# Patient Record
Sex: Female | Born: 1970 | Race: Black or African American | Hispanic: No | Marital: Married | Smoking: Never smoker
Health system: Southern US, Community
[De-identification: ages and names within clinical notes are randomized; demographics above are authoritative.]

## PROBLEM LIST (undated history)

## (undated) DIAGNOSIS — E119 Type 2 diabetes mellitus without complications: Secondary | ICD-10-CM

## (undated) DIAGNOSIS — I10 Essential (primary) hypertension: Secondary | ICD-10-CM

## (undated) HISTORY — PX: ABDOMINAL HYSTERECTOMY: SHX81

---

## 2019-06-13 ENCOUNTER — Other Ambulatory Visit: Payer: Self-pay

## 2019-06-13 ENCOUNTER — Emergency Department: Payer: Federal, State, Local not specified - PPO

## 2019-06-13 ENCOUNTER — Encounter: Payer: Self-pay | Admitting: Emergency Medicine

## 2019-06-13 ENCOUNTER — Emergency Department
Admission: EM | Admit: 2019-06-13 | Discharge: 2019-06-13 | Disposition: A | Discharge status: Home / Self Care | Payer: Federal, State, Local not specified - PPO | Attending: Emergency Medicine | Admitting: Emergency Medicine

## 2019-06-13 DIAGNOSIS — M545 Low back pain: Secondary | ICD-10-CM | POA: Diagnosis present

## 2019-06-13 DIAGNOSIS — M5442 Lumbago with sciatica, left side: Secondary | ICD-10-CM | POA: Diagnosis not present

## 2019-06-13 DIAGNOSIS — M5441 Lumbago with sciatica, right side: Secondary | ICD-10-CM | POA: Diagnosis not present

## 2019-06-13 LAB — URINALYSIS, COMPLETE (UACMP) WITH MICROSCOPIC
Bilirubin Urine: NEGATIVE
Glucose, UA: >=500 mg/dL — AB
Hgb urine dipstick: NEGATIVE
Ketones, ur: 80 mg/dL — AB
Leukocytes,Ua: NEGATIVE
Nitrite: NEGATIVE
Protein, ur: NEGATIVE mg/dL
Specific Gravity, Urine: 1.028 (ref 1.005–1.030)
pH: 5 (ref 5.0–8.0)

## 2019-06-13 LAB — GLUCOSE, CAPILLARY: Glucose-Capillary: 241 mg/dL — ABNORMAL HIGH (ref 70–99)

## 2019-06-13 MED ORDER — ETODOLAC 400 MG PO TABS: 400.0000 mg | ORAL_TABLET | Freq: Two times a day (BID) | ORAL | 0 refills | Status: DC

## 2019-06-13 MED ORDER — PREDNISONE 10 MG PO TABS: ORAL_TABLET | ORAL | 0 refills | Status: DC

## 2019-06-13 MED ORDER — HYDROCODONE-ACETAMINOPHEN 5-325 MG PO TABS: 1.0000 | ORAL_TABLET | Freq: Four times a day (QID) | ORAL | 0 refills | Status: DC | PRN

## 2019-06-13 NOTE — ED Notes (Signed)
Urine sent to lab with white pt label, lab informed

## 2019-06-13 NOTE — ED Triage Notes (Signed)
Pt to ED via POV c/o back pain that radiates down her right leg. Pt is in NAD.

## 2019-06-13 NOTE — Discharge Instructions (Addendum)
Follow-up with your primary care provider if any continued problems.  You may use ice or heat to your back as needed for discomfort.  Begin taking etodolac 400 mg twice daily with food.  Also Norco 1 every 6 hours as needed for pain was sent to your pharmacy.  Do not drive or operate machinery while taking the pain medication as it could cause drowsiness.

## 2019-06-13 NOTE — ED Provider Notes (Signed)
[facility] Emergency Department Provider Note  ____________________________________________   First MD Initiated Contact with Patient 06/13/19 1344     (approximate)  I have reviewed the triage vital signs and the nursing notes.   HISTORY  Chief Complaint Back Pain  HPI Deborah Howe is a 48 y.o. female presents to the ED with complaint of low back pain with radiation into her right leg.  Patient also states that occasionally it goes down her left leg as well.  She continues to ambulate without any assistance.  She denies any recent injury to her back and no previous sciatica.  Patient states she has been alternating Tylenol and ibuprofen without any relief.  She rates her pain as a 9 out of 10.      History reviewed. No pertinent past medical history.  There are no active problems to display for this patient.   Past Surgical History:  Procedure Laterality Date  . ABDOMINAL HYSTERECTOMY      Prior to Admission medications   Medication Sig Start Date End Date Taking? Authorizing Provider  etodolac (LODINE) 400 MG tablet Take 1 tablet (400 mg total) by mouth 2 (two) times daily. 06/13/19   Johnn Hai, PA-C  HYDROcodone-acetaminophen (NORCO/VICODIN) 5-325 MG tablet Take 1 tablet by mouth every 6 (six) hours as needed for moderate pain. 06/13/19   Johnn Hai, PA-C    Allergies Penicillins  No family history on file.  Social History Social History   Tobacco Use  . Smoking status: Never Smoker  . Smokeless tobacco: Never Used  Substance Use Topics  . Alcohol use: Yes    Comment: social  . Drug use: Not Currently    Review of Systems Constitutional: No fever/chills Cardiovascular: Denies chest pain. Respiratory: Denies shortness of breath. Gastrointestinal: No abdominal pain.  No nausea, no vomiting.  Genitourinary: Negative for dysuria. Musculoskeletal: Positive for low back pain with radiculopathy Skin: Negative for  rash. Neurological: Negative for headaches, focal weakness or numbness. ____________________________________________   PHYSICAL EXAM:  VITAL SIGNS: ED Triage Vitals  Enc Vitals Group     BP 06/13/19 1304 (!) 146/103     Pulse Rate 06/13/19 1304 (!) 128     Resp 06/13/19 1304 16     Temp 06/13/19 1304 99.6 F (37.6 C)     Temp Source 06/13/19 1304 Oral     SpO2 06/13/19 1304 97 %     Weight 06/13/19 1258 160 lb (72.6 kg)     Height 06/13/19 1258 5\' 4"  (1.626 m)     Head Circumference --      Peak Flow --      Pain Score 06/13/19 1258 9     Pain Loc --      Pain Edu? --      Excl. in [place]? --    Constitutional: Alert and oriented. Well appearing and in no acute distress. Eyes: Conjunctivae are normal.  Head: Atraumatic. Neck: No stridor.   Cardiovascular: Normal rate, regular rhythm. Grossly normal heart sounds.  Good peripheral circulation. Respiratory: Normal respiratory effort.  No retractions. Lungs CTAB. Gastrointestinal: Soft and nontender. No distention.  Musculoskeletal: No gross deformities noted on examination of lower back.  Patient has good muscle strength bilaterally.  There is bilateral SI joint tenderness with surrounding tissue.  No active muscle spasms were seen.  Good muscle strength was noted bilaterally.  Straight leg raises were essentially negative. Neurologic:  Normal speech and language. No gross focal neurologic deficits are  appreciated.  Reflexes 2+ bilaterally.  No gait instability. Skin:  Skin is warm, dry and intact. No rash noted. Psychiatric: Mood and affect are normal. Speech and behavior are normal.  ____________________________________________   LABS (all labs ordered are listed, but only abnormal results are displayed)  Labs Reviewed  URINALYSIS, COMPLETE (UACMP) WITH MICROSCOPIC - Abnormal; Notable for the following components:      Result Value   Color, Urine YELLOW (*)    APPearance CLEAR (*)    Glucose, UA >=500 (*)    Ketones, ur 80  (*)    Bacteria, UA FEW (*)    All other components within normal limits  GLUCOSE, CAPILLARY - Abnormal; Notable for the following components:   Glucose-Capillary 241 (*)    All other components within normal limits  CBG MONITORING, ED   ____________________________________________  RADIOLOGY  Official radiology report(s): Dg Lumbar Spine 2-3 Views  Result Date: 06/13/2019 CLINICAL DATA:  Pain with bilateral radiculopathy for 2 weeks EXAM: LUMBAR SPINE - 2-3 VIEW COMPARISON:  MR lumbar spine dated 12/09/2015. FINDINGS: There is no evidence of lumbar spine fracture. Alignment is normal. Intervertebral disc spaces are maintained. Mild degenerative joint disease is seen at L4-5 and L5-S1. IMPRESSION: Mild degenerative joint disease at L4-5 and L5-S1. No acute findings. Electronically Signed   By: Romona Curlsyler  Litton M.D.   On: 06/13/2019 15:12    ____________________________________________   PROCEDURES  Procedure(s) performed (including Critical Care):  Procedures   ____________________________________________   INITIAL IMPRESSION / ASSESSMENT AND PLAN / ED COURSE  As part of my medical decision making, I reviewed the following data within the electronic MEDICAL RECORD NUMBER Notes from prior ED visits and [place] Controlled Substance Database  48 year old female presents to the ED with complaint of low back pain bilaterally with radiation into both her lower extremities however she complains of her right leg more than the left.  Patient continues to ambulate without any assistance.  She is taken over-the-counter medication without any relief.  She denies any recent injury.  Physical exam is consistent with sciatica however patient has not had her back ever x-rayed.  Lumbar spine x-ray showed degenerative changes only.  Patient was made aware.  She was placed on prednisone for inflammation and also Norco every 6 hours if needed for pain.  Prior to discharge patient was concerned that her urine was  not checked.  Urinalysis was negative for infection.  Ketones were noted in the urine and patient was encouraged to increase fluids.  She is to follow-up with her PCP.  Nonfasting glucose in the ED was 241 and prednisone taper was discontinued and etodolac was sent to the pharmacy instead.  ____________________________________________   FINAL CLINICAL IMPRESSION(S) / ED DIAGNOSES  Final diagnoses:  Acute bilateral low back pain with bilateral sciatica     ED Discharge Orders         Ordered    predniSONE (DELTASONE) 10 MG tablet  Status:  Discontinued     06/13/19 1536    HYDROcodone-acetaminophen (NORCO/VICODIN) 5-325 MG tablet  Every 6 hours PRN     06/13/19 1536    etodolac (LODINE) 400 MG tablet  2 times daily,   Status:  Discontinued     06/13/19 1633    etodolac (LODINE) 400 MG tablet  2 times daily     06/13/19 1633           Note:  This document was prepared using Dragon voice recognition software and may include unintentional dictation  errors.    Tommi Rumps, PA-C 06/14/19 1027    Jene Every, MD 06/14/19 [phone]

## 2019-06-13 NOTE — ED Notes (Signed)
Pt ambulatory in room with a steady gait.  

## 2019-06-22 ENCOUNTER — Emergency Department
Admission: EM | Admit: 2019-06-22 | Discharge: 2019-06-22 | Disposition: A | Discharge status: Home / Self Care | Payer: Federal, State, Local not specified - PPO | Attending: Student | Admitting: Student

## 2019-06-22 ENCOUNTER — Other Ambulatory Visit: Payer: Self-pay

## 2019-06-22 ENCOUNTER — Encounter: Payer: Self-pay | Admitting: Emergency Medicine

## 2019-06-22 DIAGNOSIS — M5431 Sciatica, right side: Secondary | ICD-10-CM | POA: Diagnosis not present

## 2019-06-22 DIAGNOSIS — M545 Low back pain: Secondary | ICD-10-CM | POA: Diagnosis present

## 2019-06-22 DIAGNOSIS — Z79899 Other long term (current) drug therapy: Secondary | ICD-10-CM | POA: Insufficient documentation

## 2019-06-22 MED ORDER — TIZANIDINE HCL 4 MG PO TABS: 4.0000 mg | ORAL_TABLET | Freq: Three times a day (TID) | ORAL | 0 refills | Status: DC

## 2019-06-22 MED ORDER — TIZANIDINE HCL 4 MG PO TABS
4.0000 mg | ORAL_TABLET | Freq: Once | ORAL | Status: AC
  Administered 2019-06-22: 4 mg via ORAL
  Filled 2019-06-22: qty 1

## 2019-06-22 NOTE — ED Notes (Signed)
Patient was seen here and at urgent care for same complaint of back pain with pain radiating down right leg. Patient still c/o right leg pain. Patient states oxycodone helps her sleep, but other medications are not helping the pain.

## 2019-06-22 NOTE — ED Provider Notes (Signed)
[facility] Emergency Department Provider Note ____________________________________________  Time seen: Approximately 11:01 PM  I have reviewed the triage vital signs and the nursing notes.  HISTORY  Chief Complaint Back Pain   HPI Deborah Howe is a 48 y.o. female who presents to the emergency department for treatment and evaluation of right side back pain.  No trauma.  She was treated and evaluated here on 06/14/2019 and then treated again for the same at [facility] clinic urgent care on 06/19/2019.  She states she is not received any relief from the prednisone or Flexeril.  She states that the oxycodone helps her sleep but otherwise her pain has not improved.  She denies any new symptoms but states that she will not be able to work with the amount of pain that she is in.  She states that the acute care would not provide her with a work excuse.  History reviewed. No pertinent past medical history.  There are no active problems to display for this patient.   Past Surgical History:  Procedure Laterality Date  . ABDOMINAL HYSTERECTOMY      Prior to Admission medications   Medication Sig Start Date End Date Taking? Authorizing Provider  etodolac (LODINE) 400 MG tablet Take 1 tablet (400 mg total) by mouth 2 (two) times daily. 06/13/19   Johnn Hai, PA-C  HYDROcodone-acetaminophen (NORCO/VICODIN) 5-325 MG tablet Take 1 tablet by mouth every 6 (six) hours as needed for moderate pain. 06/13/19   Johnn Hai, PA-C  tiZANidine (ZANAFLEX) 4 MG tablet Take 1 tablet (4 mg total) by mouth 3 (three) times daily. 06/22/19 06/21/20  Sherrie George B, FNP    Allergies Penicillins  No family history on file.  Social History Social History   Tobacco Use  . Smoking status: Never Smoker  . Smokeless tobacco: Never Used  Substance Use Topics  . Alcohol use: Yes    Comment: social  . Drug use: Not Currently    Review of Systems Constitutional: Well  appearing. Respiratory: Negative for dyspnea. Cardiovascular: Negative for change in skin temperature or color. Musculoskeletal:   Negative for chronic steroid use   Negative for trauma in the presence of osteoporosis  Negative for age over 72 and trauma.  Negative for constitutional symptoms, or history of cancer  Negative for pain worse at night. Skin: Negative for rash, lesion, or wound.  Genitourinary: Negative for urinary retention. Rectal: Negative for fecal incontinence or new onset constipation/bowel habit changes. Hematological/Immunilogical: Negative for immunosuppression, IV drug use, or fever Neurological: Positive for burning, tingling, numb, electric, radiating pain in the right lower extremity.                        Negative for saddle anesthesia.                        Negative for focal neurologic deficit, progressive or disabling symptoms             Negative for saddle anesthesia. ____________________________________________   PHYSICAL EXAM:  VITAL SIGNS: ED Triage Vitals  Enc Vitals Group     BP 06/22/19 1858 (!) 155/89     Pulse Rate 06/22/19 1856 100     Resp 06/22/19 1856 16     Temp 06/22/19 1856 98.6 F (37 C)     Temp Source 06/22/19 1856 Oral     SpO2 06/22/19 1856 100 %     Weight 06/22/19 1857  156 lb (70.8 kg)     Height 06/22/19 1857 5\' 4"  (1.626 m)     Head Circumference --      Peak Flow --      Pain Score 06/22/19 1857 9     Pain Loc --      Pain Edu? --      Excl. in [place]? --     Constitutional: Alert and oriented. Well appearing and in no acute distress. Eyes: Conjunctivae are clear without discharge or drainage.  Head: Atraumatic. Neck: Full, active range of motion. Respiratory: Respirations even and unlabored. Musculoskeletal: Guarded ROM of the back and right extremitiy Strength 5/5 of the lower extremities as tested. Neurologic: Reflexes of the lower extremities are 2+.  Unwilling to attempt straight leg raise on the right  side. Skin: Atraumatic.  Psychiatric: Behavior and affect are normal.  ____________________________________________   LABS (all labs ordered are listed, but only abnormal results are displayed)  Labs Reviewed - No data to display ____________________________________________  RADIOLOGY  Not indicated ____________________________________________   PROCEDURES  Procedure(s) performed:  Procedures ____________________________________________   INITIAL IMPRESSION / ASSESSMENT AND PLAN / ED COURSE  Pheonix Howe is a 48 y.o. female presenting to the emergency department mainly for work excuse until she can get into see Dr. 44 on 07/01/2019.  She also requested that change be made to her medication that may provide her with additional relief.  I will change her Flexeril to tizanidine and give her a work note for the next 4 days.  She was advised that the emergency department is unable to provide work excuses for an extended period of time due to the 13/06/2019 requirements of most employers.  She was encouraged to establish a primary care provider who may be able to help her with this.  Patient was able to ambulate out of the department without assistance.  Medications  tiZANidine (ZANAFLEX) tablet 4 mg (4 mg Oral Given 06/22/19 2113)    ED Discharge Orders         Ordered    tiZANidine (ZANAFLEX) 4 MG tablet  3 times daily     06/22/19 2025           Pertinent labs & imaging results that were available during my care of the patient were reviewed by me and considered in my medical decision making (see chart for details).  _________________________________________   FINAL CLINICAL IMPRESSION(S) / ED DIAGNOSES  Final diagnoses:  Sciatica of right side     If controlled substance prescribed during this visit, 12 month history viewed on the [facility] prior to issuing an initial prescription for Schedule II or III opiod.   2026, FNP 06/22/19 2306    2307., MD 06/22/19 2348

## 2019-06-22 NOTE — ED Triage Notes (Signed)
Pt here with c/o lower back pain radiating down right leg, was seen for the same on 06/13/19. NAD.

## 2019-06-22 NOTE — ED Notes (Signed)

## 2019-11-29 ENCOUNTER — Encounter: Payer: Self-pay | Admitting: Emergency Medicine

## 2019-11-29 ENCOUNTER — Other Ambulatory Visit: Payer: Self-pay

## 2019-11-29 ENCOUNTER — Emergency Department
Admission: EM | Admit: 2019-11-29 | Discharge: 2019-11-29 | Disposition: A | Discharge status: Home / Self Care | Payer: Federal, State, Local not specified - PPO | Attending: Emergency Medicine | Admitting: Emergency Medicine

## 2019-11-29 ENCOUNTER — Emergency Department: Payer: Federal, State, Local not specified - PPO

## 2019-11-29 DIAGNOSIS — R591 Generalized enlarged lymph nodes: Secondary | ICD-10-CM

## 2019-11-29 DIAGNOSIS — K047 Periapical abscess without sinus: Secondary | ICD-10-CM | POA: Insufficient documentation

## 2019-11-29 DIAGNOSIS — R519 Headache, unspecified: Secondary | ICD-10-CM | POA: Diagnosis present

## 2019-11-29 LAB — CBC WITH DIFFERENTIAL/PLATELET
Abs Immature Granulocytes: 0.02 10*3/uL (ref 0.00–0.07)
Basophils Absolute: 0.1 10*3/uL (ref 0.0–0.1)
Basophils Relative: 1 %
Eosinophils Absolute: 0.1 10*3/uL (ref 0.0–0.5)
Eosinophils Relative: 1 %
HCT: 39.2 % (ref 36.0–46.0)
Hemoglobin: 12.7 g/dL (ref 12.0–15.0)
Immature Granulocytes: 0 %
Lymphocytes Relative: 43 %
Lymphs Abs: 3.2 10*3/uL (ref 0.7–4.0)
MCH: 28.3 pg (ref 26.0–34.0)
MCHC: 32.4 g/dL (ref 30.0–36.0)
MCV: 87.5 fL (ref 80.0–100.0)
Monocytes Absolute: 0.4 10*3/uL (ref 0.1–1.0)
Monocytes Relative: 6 %
Neutro Abs: 3.6 10*3/uL (ref 1.7–7.7)
Neutrophils Relative %: 49 %
Platelets: 306 10*3/uL (ref 150–400)
RBC: 4.48 MIL/uL (ref 3.87–5.11)
RDW: 12.7 % (ref 11.5–15.5)
WBC: 7.4 10*3/uL (ref 4.0–10.5)
nRBC: 0 % (ref 0.0–0.2)

## 2019-11-29 LAB — BASIC METABOLIC PANEL
Anion gap: 9 (ref 5–15)
BUN: 18 mg/dL (ref 6–20)
CO2: 26 mmol/L (ref 22–32)
Calcium: 9.3 mg/dL (ref 8.9–10.3)
Chloride: 101 mmol/L (ref 98–111)
Creatinine, Ser: 0.67 mg/dL (ref 0.44–1.00)
GFR calc Af Amer: >60 mL/min (ref >60)
GFR calc non Af Amer: >60 mL/min (ref >60)
Glucose, Bld: 96 mg/dL (ref 70–99)
Potassium: 3.7 mmol/L (ref 3.5–5.1)
Sodium: 136 mmol/L (ref 135–145)

## 2019-11-29 MED ORDER — CLINDAMYCIN HCL 300 MG PO CAPS: 300.0000 mg | ORAL_CAPSULE | Freq: Three times a day (TID) | ORAL | 0 refills | Status: AC

## 2019-11-29 MED ORDER — KETOROLAC TROMETHAMINE 30 MG/ML IJ SOLN
30.0000 mg | Freq: Once | INTRAMUSCULAR | Status: AC
  Administered 2019-11-29: 18:00:00 30 mg via INTRAVENOUS
  Filled 2019-11-29: qty 1

## 2019-11-29 MED ORDER — IOHEXOL 300 MG/ML  SOLN
75.0000 mL | Freq: Once | INTRAMUSCULAR | Status: AC | PRN
  Administered 2019-11-29: 18:00:00 75 mL via INTRAVENOUS
  Filled 2019-11-29: qty 75

## 2019-11-29 MED ORDER — CLINDAMYCIN HCL 150 MG PO CAPS
300.0000 mg | ORAL_CAPSULE | Freq: Once | ORAL | Status: AC
  Administered 2019-11-29: 19:00:00 300 mg via ORAL
  Filled 2019-11-29: qty 2

## 2019-11-29 MED ORDER — PREDNISONE 20 MG PO TABS: 20.0000 mg | ORAL_TABLET | Freq: Two times a day (BID) | ORAL | 0 refills | Status: AC

## 2019-11-29 MED ORDER — PREDNISONE 20 MG PO TABS
60.0000 mg | ORAL_TABLET | Freq: Once | ORAL | Status: AC
  Administered 2019-11-29: 19:00:00 60 mg via ORAL
  Filled 2019-11-29: qty 3

## 2019-11-29 NOTE — ED Notes (Addendum)
See triage note, pt reports right sided facial pain that started on Friday, along with redness in right eye.  Denies fevers.  Denies head injury or difficulty swallowing No facial swelling noted.  Reports taking tylenol for the pain with no relief.  Pt in NAD at this time

## 2019-11-29 NOTE — ED Triage Notes (Signed)
Pt arrived via POV with reports of right side facial pain that started on Friday, states the pain comes and goes, rates the pain 10/10.   Pt denies any pain with chewing on that side.

## 2019-11-29 NOTE — ED Provider Notes (Signed)
[facility] Emergency Department Provider Note ____________________________________________  Time seen: 1502  I have reviewed the triage vital signs and the nursing notes.  HISTORY  Chief Complaint  Facial Pain  HPI Deborah Howe is a 49 y.o. female presents herself to the ED with  right-sided facial pain that started on Friday.  Patient describes intermittent pain that waxes and wanes.  She rates the pain at a 10 out of 10 when it is at its worse.  Patient describes pain and fullness to the submandibular region with referral to the right ear.  She reports feeling a nodule under the chin that is tender to palpation.  She denies any interim fever, chills, sweats patient also denies any recent dental work, mastication pain, or facial swelling.  Patient denies any fever, chills, nausea, vomiting, vertigo, dizziness, or tinnitus.  Denies any focal gum swelling, jaw claudication, or TMJ dysfunction.  History reviewed. No pertinent past medical history.  There are no problems to display for this patient.   Past Surgical History:  Procedure Laterality Date  . ABDOMINAL HYSTERECTOMY      Prior to Admission medications   Medication Sig Start Date End Date Taking? Authorizing Provider  clindamycin (CLEOCIN) 300 MG capsule Take 1 capsule (300 mg total) by mouth 3 (three) times daily for 10 days. 11/29/19 12/09/19  Shayonna Ocampo, Charlesetta Ivory, PA-C  predniSONE (DELTASONE) 20 MG tablet Take 1 tablet (20 mg total) by mouth 2 (two) times daily with a meal for 5 days. 11/29/19 12/04/19  Laqueshia Cihlar, Charlesetta Ivory, PA-C    Allergies Lisinopril, Penicillin g, and Penicillins  No family history on file.  Social History Social History   Tobacco Use  . Smoking status: Never Smoker  . Smokeless tobacco: Never Used  Substance Use Topics  . Alcohol use: Yes    Comment: social  . Drug use: Not Currently    Review of Systems  Constitutional: Negative for fever. Eyes: Negative  for visual changes. ENT: Negative for sore throat.  Right jaw and submandibular pain and swelling as above. Cardiovascular: Negative for chest pain. Respiratory: Negative for shortness of breath. Gastrointestinal: Negative for abdominal pain, vomiting and diarrhea. Genitourinary: Negative for dysuria. Musculoskeletal: Negative for back pain. Skin: Negative for rash. Neurological: Negative for headaches, focal weakness or numbness. ____________________________________________  PHYSICAL EXAM:  VITAL SIGNS: ED Triage Vitals  Enc Vitals Group     BP 11/29/19 1407 131/83     Pulse Rate 11/29/19 1407 90     Resp 11/29/19 1407 18     Temp 11/29/19 1407 98.5 F (36.9 C)     Temp Source 11/29/19 1407 Oral     SpO2 11/29/19 1407 97 %     Weight 11/29/19 1411 178 lb (80.7 kg)     Height 11/29/19 1411 5\' 4"  (1.626 m)     Head Circumference --      Peak Flow --      Pain Score 11/29/19 1408 10     Pain Loc --      Pain Edu? --      Excl. in [place]? --     Constitutional: Alert and oriented. Well appearing and in no distress. Head: Normocephalic and atraumatic. Eyes: Conjunctivae are normal. PERRL. Normal extraocular movements Ears: Canals clear. TMs intact bilaterally. Nose: No congestion/rhinorrhea/epistaxis. Mouth/Throat: Mucous membranes are moist.  Uvula is midline and tonsils are flat.  No oropharyngeal lesions are appreciated.  No focal gum swelling, or brawny sublingual edema noted. Neck: Supple.  No thyromegaly. Hematological/Lymphatic/Immunological: No cervical, preauricular, or cervical lymphadenopathy.  Patient does have a palpable nodular lesion to the submandibular region on the right which is consistent with her pain location. Cardiovascular: Normal rate, regular rhythm. Normal distal pulses. Respiratory: Normal respiratory effort. No wheezes/rales/rhonchi. Gastrointestinal: Soft and nontender. No distention. Musculoskeletal: Nontender with normal range of motion in all  extremities.  Neurologic:  Normal gait without ataxia. Normal speech and language. No gross focal neurologic deficits are appreciated. Skin:  Skin is warm, dry and intact. No rash noted. Psychiatric: Mood and affect are normal. Patient exhibits appropriate insight and judgment. ____________________________________________   LABS (pertinent positives/negatives) Labs Reviewed  BASIC METABOLIC PANEL  CBC WITH DIFFERENTIAL/PLATELET  ____________________________________________   RADIOLOGY  CT Maxillofacial w/ CM  IMPRESSION: 1. No acute inflammatory changes or other abnormality about the face. 2. Periapical lucencies about the left maxillary second bicuspid and first molar without associated inflammatory changes. ____________________________________________  PROCEDURES  Toradol 30 mg IVP Clindamycin 300 mg PO Prednisone 60 mg PO  Procedures ____________________________________________  INITIAL IMPRESSION / ASSESSMENT AND PLAN / ED COURSE  Patient with ED evaluation of swelling and pain to the right submandibular region.  Patient exam is overall benign reassuring there is some palpable tenderness to the submandibular region concerning for an abscess versus lymph node versus salivary gland.  Labs are benign without any signs of acute leukocytosis and no signs of acute oral infection.  CT does not reveal any amatory changes to the face.  There are periapical lucencies to the left maxillary region but no focal abscess.  Patient will be treated empirically with clindamycin and prednisone for lymphadenopathy.  She will follow-up with her primary provider dental provider for ongoing symptoms.  Return precautions have been reviewed.  Deborah Howe was evaluated in Emergency Department on 11/29/2019 for the symptoms described in the history of present illness. She was evaluated in the context of the global COVID-19 pandemic, which necessitated consideration that the patient might be at risk  for infection with the SARS-CoV-2 virus that causes COVID-19. Institutional protocols and algorithms that pertain to the evaluation of patients at risk for COVID-19 are in a state of rapid change based on information released by regulatory bodies including the CDC and federal and state organizations. These policies and algorithms were followed during the patient's care in the ED. ____________________________________________  FINAL CLINICAL IMPRESSION(S) / ED DIAGNOSES  Final diagnoses:  Lymphadenopathy  Dental abscess      Carmie End, Dannielle Karvonen, PA-C 11/29/19 2015    Vanessa [place], MD 11/30/19 [phone]

## 2019-11-29 NOTE — Discharge Instructions (Signed)
Your exam, labs, and CT scan were essentially normal. You will be treated with steroids and antibiotics for and enlarged lymph node. Follow-up with your primary provider and Dental provider as needed. Return as needed.

## 2020-08-31 ENCOUNTER — Emergency Department
Admission: EM | Admit: 2020-08-31 | Discharge: 2020-08-31 | Disposition: A | Discharge status: Home / Self Care | Payer: Federal, State, Local not specified - PPO | Attending: Emergency Medicine | Admitting: Emergency Medicine

## 2020-08-31 ENCOUNTER — Other Ambulatory Visit: Payer: Self-pay

## 2020-08-31 ENCOUNTER — Encounter: Payer: Self-pay | Admitting: Emergency Medicine

## 2020-08-31 ENCOUNTER — Emergency Department: Payer: Federal, State, Local not specified - PPO

## 2020-08-31 DIAGNOSIS — I10 Essential (primary) hypertension: Secondary | ICD-10-CM | POA: Insufficient documentation

## 2020-08-31 DIAGNOSIS — R009 Unspecified abnormalities of heart beat: Secondary | ICD-10-CM | POA: Diagnosis present

## 2020-08-31 DIAGNOSIS — E119 Type 2 diabetes mellitus without complications: Secondary | ICD-10-CM | POA: Insufficient documentation

## 2020-08-31 DIAGNOSIS — I493 Ventricular premature depolarization: Secondary | ICD-10-CM | POA: Diagnosis not present

## 2020-08-31 HISTORY — DX: Essential (primary) hypertension: I10

## 2020-08-31 HISTORY — DX: Type 2 diabetes mellitus without complications: E11.9

## 2020-08-31 LAB — CBC
HCT: 40.8 % (ref 36.0–46.0)
Hemoglobin: 13.1 g/dL (ref 12.0–15.0)
MCH: 27.9 pg (ref 26.0–34.0)
MCHC: 32.1 g/dL (ref 30.0–36.0)
MCV: 87 fL (ref 80.0–100.0)
Platelets: 268 10*3/uL (ref 150–400)
RBC: 4.69 MIL/uL (ref 3.87–5.11)
RDW: 13.6 % (ref 11.5–15.5)
WBC: 6 10*3/uL (ref 4.0–10.5)
nRBC: 0 % (ref 0.0–0.2)

## 2020-08-31 LAB — BASIC METABOLIC PANEL
Anion gap: 12 (ref 5–15)
BUN: 9 mg/dL (ref 6–20)
CO2: 26 mmol/L (ref 22–32)
Calcium: 9.3 mg/dL (ref 8.9–10.3)
Chloride: 101 mmol/L (ref 98–111)
Creatinine, Ser: 0.73 mg/dL (ref 0.44–1.00)
GFR, Estimated: >60 mL/min (ref >60)
Glucose, Bld: 113 mg/dL — ABNORMAL HIGH (ref 70–99)
Potassium: 3.8 mmol/L (ref 3.5–5.1)
Sodium: 139 mmol/L (ref 135–145)

## 2020-08-31 NOTE — ED Provider Notes (Signed)
[facility] Emergency Department Provider Note   ____________________________________________    I have reviewed the triage vital signs and the nursing notes.   HISTORY  Chief Complaint Irregular Heart Beat     HPI Deborah Howe is a 50 y.o. female with history of diabetes and high blood pressure who presents with reports of abnormal heart rate after having a colonoscopy this morning.  They reported to her after her procedure that she had an irregular heart rate and that she should have this evaluated.  She has no complaints.  No chest pain palpitations shortness of breath.  She is asymptomatic.  Past Medical History:  Diagnosis Date  . Diabetes mellitus without complication (HCC)    Type II  . Hypertension     There are no problems to display for this patient.   Past Surgical History:  Procedure Laterality Date  . ABDOMINAL HYSTERECTOMY      Prior to Admission medications   Not on File     Allergies Lisinopril, Penicillin g, and Penicillins  History reviewed. No pertinent family history.  Social History Social History   Tobacco Use  . Smoking status: Never Smoker  . Smokeless tobacco: Never Used  Substance Use Topics  . Alcohol use: Yes    Comment: social  . Drug use: Not Currently    Review of Systems  Constitutional: No fever/chills Eyes: No visual changes.  ENT: No sore throat. Cardiovascular: As above Respiratory: Denies shortness of breath. Gastrointestinal: No abdominal pain.  No nausea, no vomiting.   Genitourinary: Negative for dysuria. Musculoskeletal: Negative for back pain. Skin: Negative for rash. Neurological: Negative for headaches or weakness   ____________________________________________   PHYSICAL EXAM:  VITAL SIGNS: ED Triage Vitals  Enc Vitals Group     BP 08/31/20 1104 (!) 141/89     Pulse Rate 08/31/20 1104 68     Resp 08/31/20 1104 18     Temp 08/31/20 1104 97.8 F (36.6 C)      Temp Source 08/31/20 1104 Oral     SpO2 08/31/20 1104 100 %     Weight 08/31/20 1106 81.6 kg (180 lb)     Height 08/31/20 1106 1.626 m (5\' 4" )     Head Circumference --      Peak Flow --      Pain Score 08/31/20 1104 7     Pain Loc --      Pain Edu? --      Excl. in [place]? --     Constitutional: Alert and oriented. No acute distress  Nose: No congestion/rhinnorhea. Mouth/Throat: Mucous membranes are moist.   Neck:  Painless ROM Cardiovascular: Normal rate, regular rhythm. Grossly normal heart sounds.  Good peripheral circulation.  Occasional PVCs noted Respiratory: Normal respiratory effort.  No retractions. Lungs CTAB.   Musculoskeletal:.  Warm and well perfused Neurologic:  Normal speech and language. No gross focal neurologic deficits are appreciated.  Skin:  Skin is warm, dry and intact. No rash noted. Psychiatric: Mood and affect are normal. Speech and behavior are normal.  ____________________________________________   LABS (all labs ordered are listed, but only abnormal results are displayed)  Labs Reviewed  BASIC METABOLIC PANEL - Abnormal; Notable for the following components:      Result Value   Glucose, Bld 113 (*)    All other components within normal limits  CBC   ____________________________________________  EKG  ED ECG REPORT I, 10/29/20, the attending physician, personally viewed and interpreted this  ECG.  Date: 08/31/2020  Rhythm: normal sinus rhythm QRS Axis: normal Intervals: PVCs noted ST/T Wave abnormalities: normal Narrative Interpretation: no evidence of acute ischemia  ____________________________________________  RADIOLOGY  Chest x-ray viewed by me, no infiltrate or effusion ____________________________________________   PROCEDURES  Procedure(s) performed: No  Procedures   Critical Care performed: No ____________________________________________   INITIAL IMPRESSION / ASSESSMENT AND PLAN / ED COURSE  Pertinent labs &  imaging results that were available during my care of the patient were reviewed by me and considered in my medical decision making (see chart for details).  Patient presents for evaluation after irregular heartbeat after colonoscopy.  She is asymptomatic.  EKG is consistent with intermittent PVCs  Her exam and lab work are quite reassuring, chest x-ray is unremarkable  Review of medical records demonstrates EKG from 2018 which demonstrated similar PVC findings.  This is likely a chronic issue, regardless we'll have her follow-up closely with cardiology, return precautions discussed    ____________________________________________   FINAL CLINICAL IMPRESSION(S) / ED DIAGNOSES  Final diagnoses:  PVC's (premature ventricular contractions)        Note:  This document was prepared using Dragon voice recognition software and may include unintentional dictation errors.   Jene Every, MD 08/31/20 1311

## 2020-08-31 NOTE — ED Triage Notes (Signed)
Pt comes into the ED via POV c/o irregular HR that was found while completing pre-screening for her colonoscopy.  Pt denies any cardiac events or irregular EKG's in the past.  Pt denies any cp, nausea, dizziness, etc.  Pt states she has had some SHOB today with a headache, but states her COVID test came back negative.

## 2022-10-14 ENCOUNTER — Other Ambulatory Visit: Payer: Self-pay

## 2022-10-14 ENCOUNTER — Emergency Department: Payer: Federal, State, Local not specified - PPO

## 2022-10-14 ENCOUNTER — Emergency Department
Admission: EM | Admit: 2022-10-14 | Discharge: 2022-10-14 | Disposition: A | Discharge status: Home / Self Care | Payer: Federal, State, Local not specified - PPO | Attending: Emergency Medicine | Admitting: Emergency Medicine

## 2022-10-14 ENCOUNTER — Encounter: Payer: Self-pay | Admitting: Emergency Medicine

## 2022-10-14 DIAGNOSIS — I10 Essential (primary) hypertension: Secondary | ICD-10-CM | POA: Diagnosis not present

## 2022-10-14 DIAGNOSIS — R079 Chest pain, unspecified: Secondary | ICD-10-CM | POA: Diagnosis present

## 2022-10-14 DIAGNOSIS — E119 Type 2 diabetes mellitus without complications: Secondary | ICD-10-CM | POA: Insufficient documentation

## 2022-10-14 LAB — BASIC METABOLIC PANEL
Anion gap: 11 (ref 5–15)
BUN: 16 mg/dL (ref 6–20)
CO2: 23 mmol/L (ref 22–32)
Calcium: 9.1 mg/dL (ref 8.9–10.3)
Chloride: 102 mmol/L (ref 98–111)
Creatinine, Ser: 0.76 mg/dL (ref 0.44–1.00)
GFR, Estimated: >60 mL/min (ref >60)
Glucose, Bld: 156 mg/dL — ABNORMAL HIGH (ref 70–99)
Potassium: 3.4 mmol/L — ABNORMAL LOW (ref 3.5–5.1)
Sodium: 136 mmol/L (ref 135–145)

## 2022-10-14 LAB — CBC
HCT: 40.6 % (ref 36.0–46.0)
Hemoglobin: 13.2 g/dL (ref 12.0–15.0)
MCH: 28.6 pg (ref 26.0–34.0)
MCHC: 32.5 g/dL (ref 30.0–36.0)
MCV: 87.9 fL (ref 80.0–100.0)
Platelets: 250 10*3/uL (ref 150–400)
RBC: 4.62 MIL/uL (ref 3.87–5.11)
RDW: 13.1 % (ref 11.5–15.5)
WBC: 4.7 10*3/uL (ref 4.0–10.5)
nRBC: 0 % (ref 0.0–0.2)

## 2022-10-14 LAB — TROPONIN I (HIGH SENSITIVITY): Troponin I (High Sensitivity): 3 ng/L (ref <18)

## 2022-10-14 NOTE — ED Provider Notes (Signed)
[facility] Provider Note    Event Date/Time   First MD Initiated Contact with Patient 10/14/22 1651     (approximate)  History   Chief Complaint: Chest Pain  HPI  Deborah Howe is a 52 y.o. female with a past medical history of diabetes, hypertension presents to the emergency department for intermittent chest pains.  According to the patient since yesterday she has intermittently been experiencing some pain in her left chest and left arm.  Patient states a history of left arm pain and is followed up with her doctor regarding this as had x-rays performed of her shoulder with no acute findings but her doctor is following along.  Since yesterday she has also been experiencing some intermittent sharp pains in the left chest that seem to be worse with movement of the arm.  No shortness of breath or cough.  No prior cardiac history.  Denies any pain currently.  Physical Exam   Triage Vital Signs: ED Triage Vitals  Enc Vitals Group     BP 10/14/22 1624 127/88     Pulse Rate 10/14/22 1624 79     Resp 10/14/22 1624 20     Temp 10/14/22 1624 98.6 F (37 C)     Temp Source 10/14/22 1624 Oral     SpO2 10/14/22 1624 93 %     Weight 10/14/22 1623 180 lb (81.6 kg)     Height 10/14/22 1623 '5\' 4"'$  (1.626 m)     Head Circumference --      Peak Flow --      Pain Score 10/14/22 1622 8     Pain Loc --      Pain Edu? --      Excl. in [place]? --     Most recent vital signs: Vitals:   10/14/22 1624 10/14/22 1728  BP: 127/88 115/83  Pulse: 79 60  Resp: 20 18  Temp: 98.6 F (37 C)   SpO2: 93% 100%    General: Awake, no distress.  CV:  Good peripheral perfusion.  Regular rate and rhythm  Resp:  Normal effort.  Equal breath sounds bilaterally.  Mild left chest wall tenderness to palpation. Abd:  No distention.  Soft, nontender.  No rebound or guarding.   ED Results / Procedures / Treatments   EKG  EKG viewed and interpreted by myself shows a normal sinus rhythm  81 bpm with a narrow QRS, normal axis, normal intervals, no concerning ST changes.  RADIOLOGY  I have reviewed and interpreted the chest x-ray images.  No consolidation seen on my evaluation. Radiology is read the chest x-ray as negative.   MEDICATIONS ORDERED IN ED: Medications - No data to display   IMPRESSION / MDM / [place] / ED COURSE  I reviewed the triage vital signs and the nursing notes.  Patient's presentation is most consistent with acute presentation with potential threat to life or bodily function.  Patient presents emergency department for intermittent left-sided chest pains.  Worse with movement of the left arm or palpation.  Reproducible on exam.  Highly suspect more chest wall discomfort however given the patient's location of discomfort we will check labs x-ray and EKG.  EKG is reassuring, chest x-ray is clear patient's chemistry is normal CBC is normal and troponin negative.  Given the patient's reassuring workup and reproducibility of the pain likely chest wall discomfort.  Will have the patient follow-up with her doctor.  Patient agreeable to plan.  FINAL CLINICAL IMPRESSION(S) /  ED DIAGNOSES   Chest pain   Note:  This document was prepared using Dragon voice recognition software and may include unintentional dictation errors.   Harvest Dark, MD 10/14/22 1736

## 2022-10-14 NOTE — ED Triage Notes (Signed)
Pt via POV from home. Pt c/o L sided chest pain, L arm pain that started yesterday. Endorses SOB when she was working today. Denies cardiac hx. Pt is A&Ox4 and NAD

## 2022-11-06 IMAGING — CR DG CHEST 2V
1 series · 2 of 2 positions shown · non-contrast
Comparison: None.

CLINICAL DATA: Irregular heart rate.

EXAM:
CHEST - 2 VIEW

[Series 1: dg chest 2 view · 0.14mm/px · 2 of 2 slices shown]
[im 1/2]
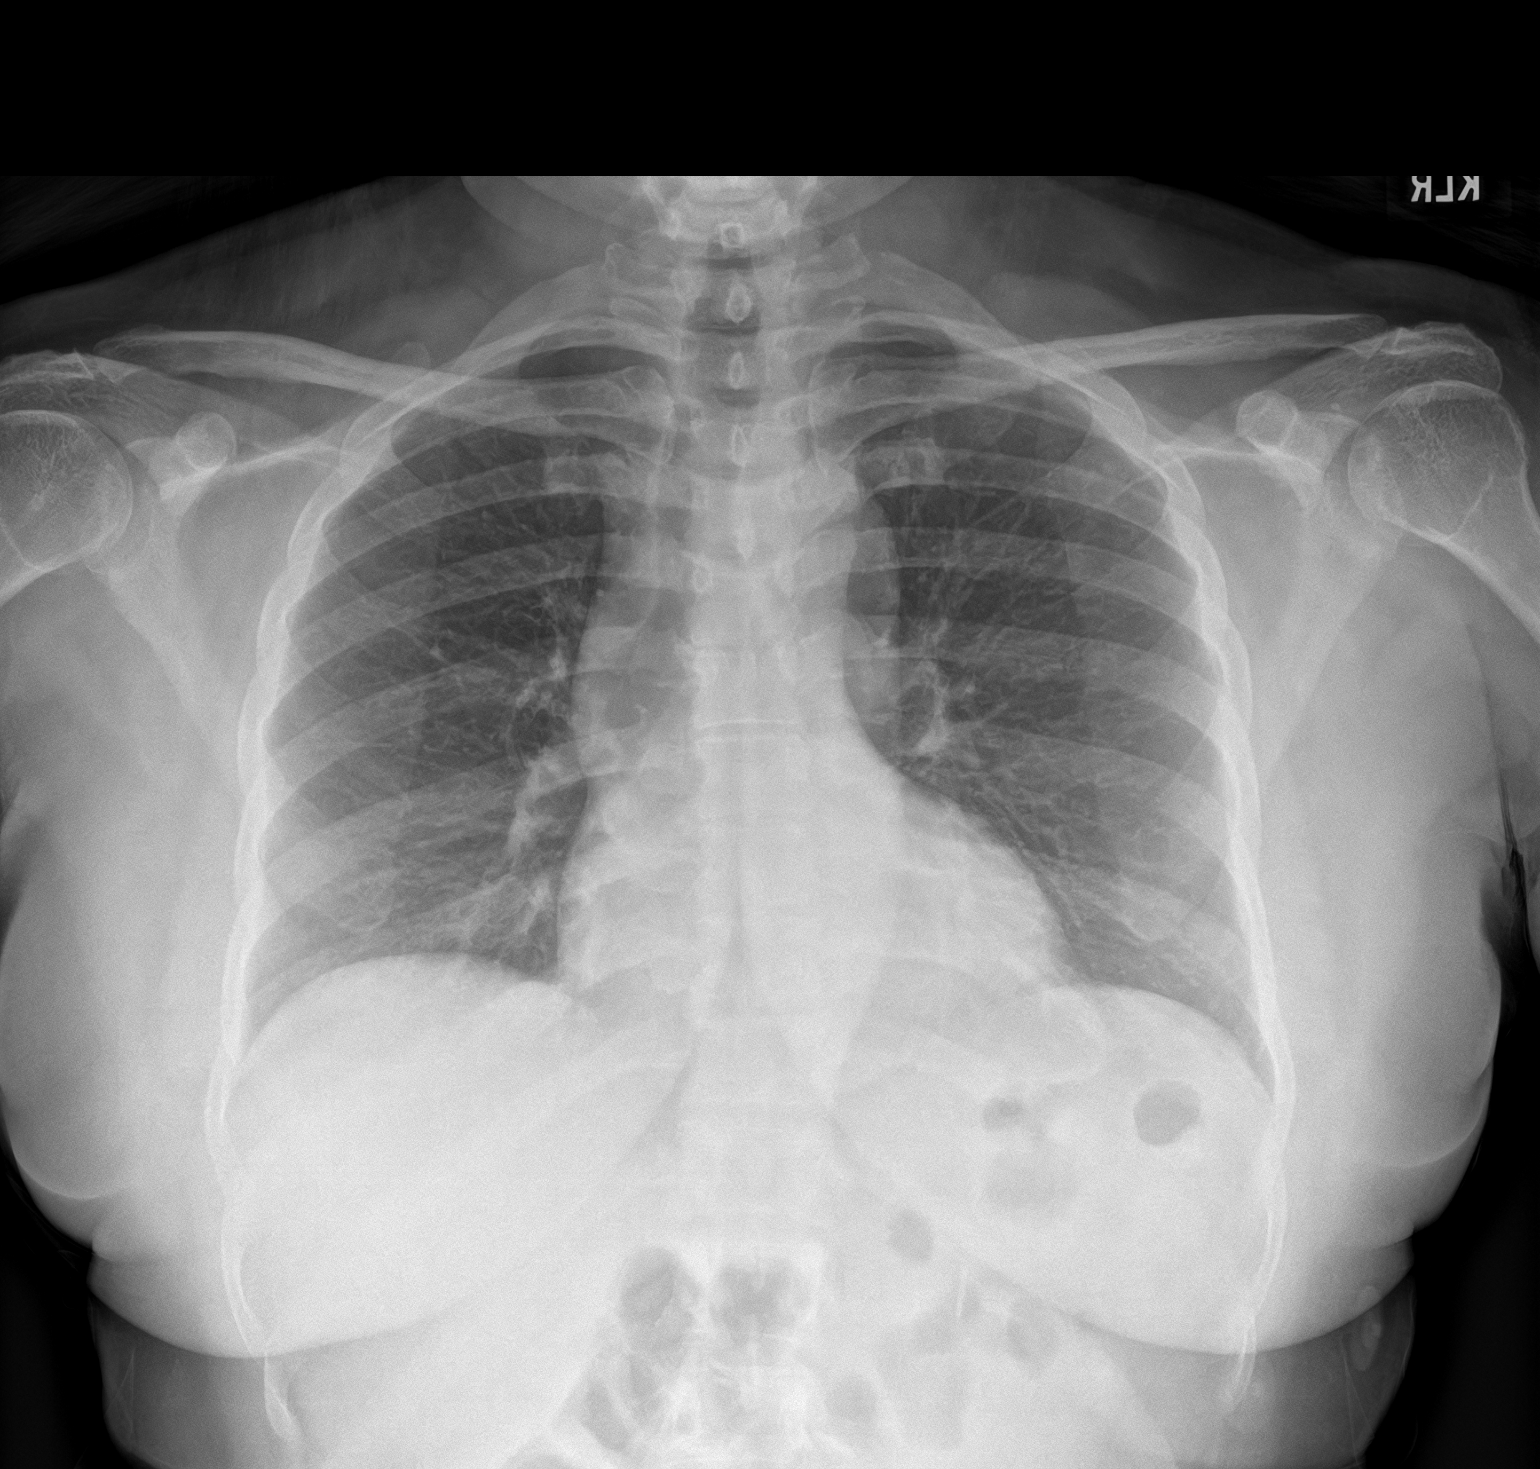
[im 2/2]
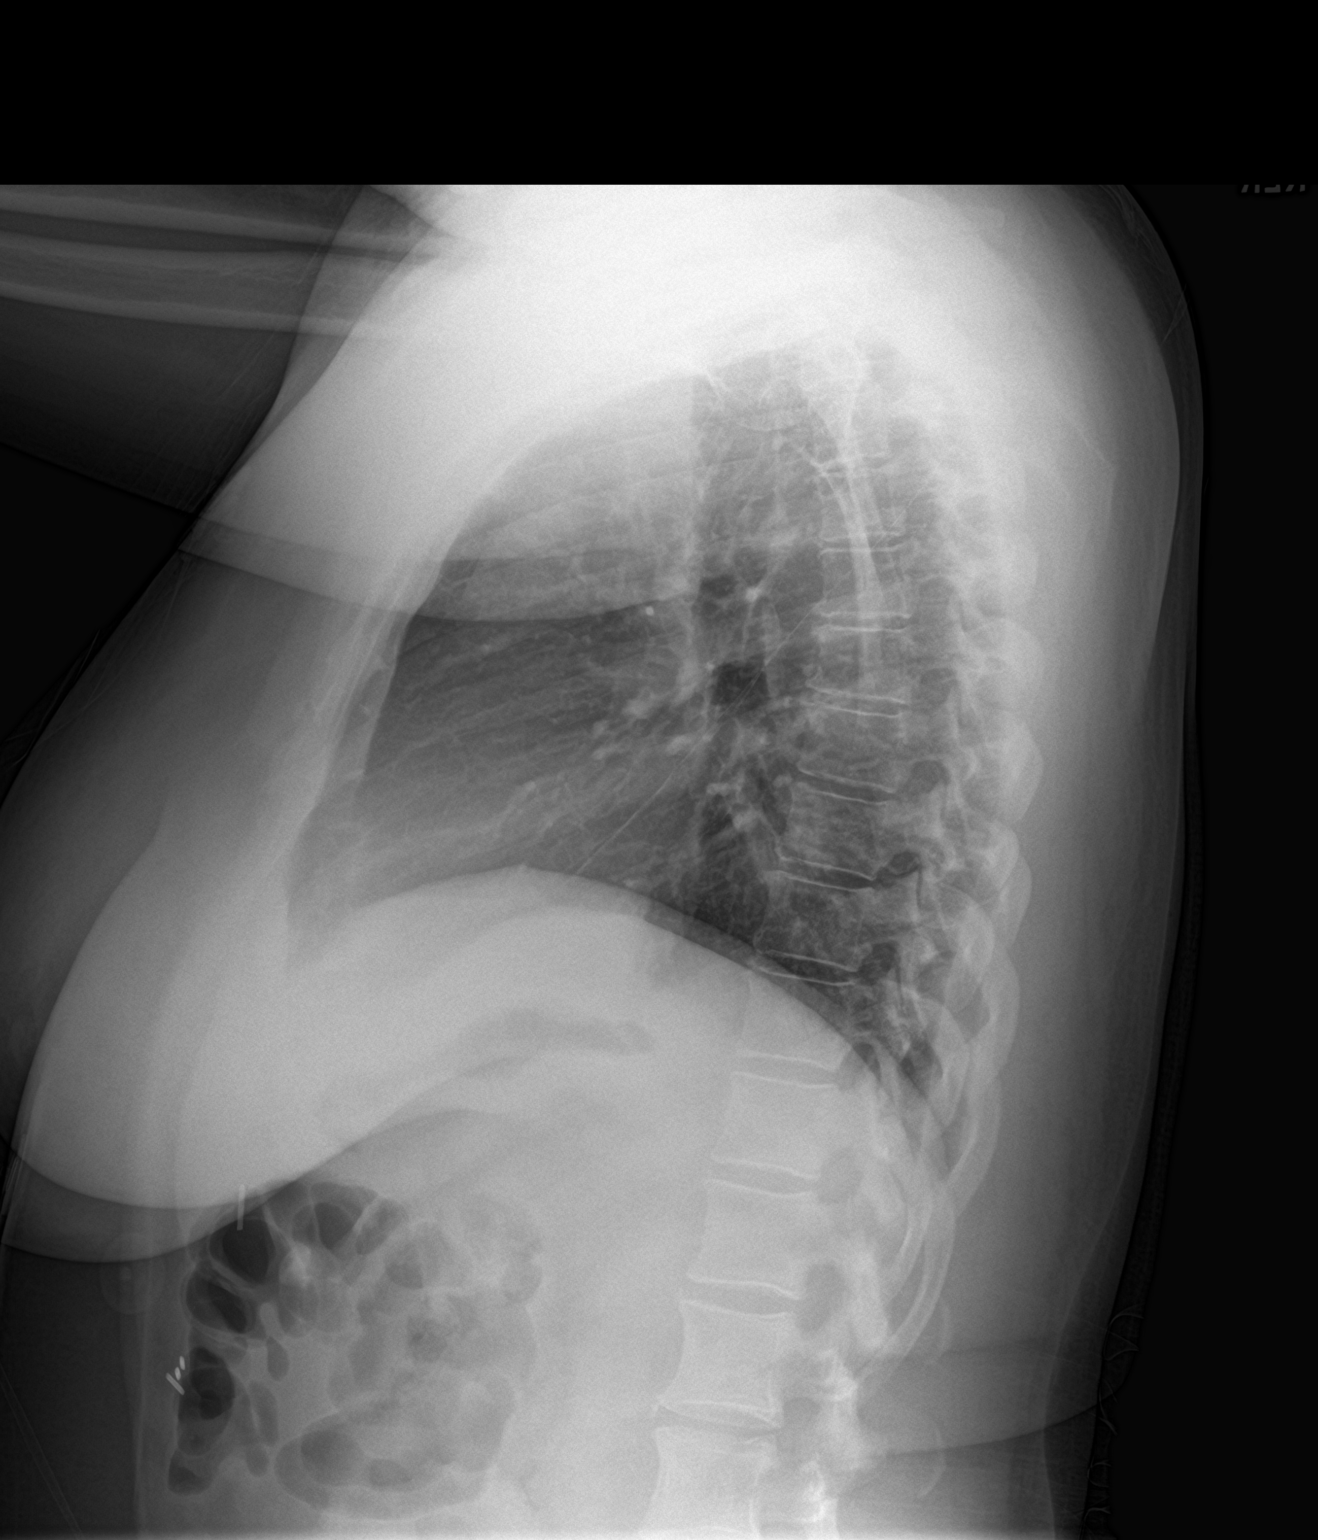

[2 of 2 positions shown; findings below may reference images not displayed]

FINDINGS: The heart size and mediastinal contours are within normal limits.
Both lungs are clear. No pneumothorax or pleural effusion is noted.
The visualized skeletal structures are unremarkable.
IMPRESSION: No active cardiopulmonary disease.

## 2022-11-09 ENCOUNTER — Other Ambulatory Visit
Admission: RE | Admit: 2022-11-09 | Discharge: 2022-11-09 | Disposition: A | Discharge status: Home / Self Care | Payer: Federal, State, Local not specified - PPO | Source: Ambulatory Visit | Attending: Internal Medicine | Admitting: Internal Medicine

## 2022-11-09 DIAGNOSIS — R0602 Shortness of breath: Secondary | ICD-10-CM | POA: Insufficient documentation

## 2022-11-09 DIAGNOSIS — R0789 Other chest pain: Secondary | ICD-10-CM | POA: Insufficient documentation

## 2022-11-09 LAB — D-DIMER, QUANTITATIVE: D-Dimer, Quant: 0.59 ug/mL-FEU — ABNORMAL HIGH (ref 0.00–0.50)

## 2023-11-11 ENCOUNTER — Ambulatory Visit
Admission: EM | Admit: 2023-11-11 | Discharge: 2023-11-11 | Disposition: A | Discharge status: Home / Self Care | Attending: Emergency Medicine | Admitting: Emergency Medicine

## 2023-11-11 DIAGNOSIS — J069 Acute upper respiratory infection, unspecified: Secondary | ICD-10-CM

## 2023-11-11 LAB — POCT RAPID STREP A (OFFICE): Rapid Strep A Screen: NEGATIVE

## 2023-11-11 LAB — POC COVID19/FLU A&B COMBO
Covid Antigen, POC: NEGATIVE
Influenza A Antigen, POC: NEGATIVE
Influenza B Antigen, POC: NEGATIVE

## 2023-11-11 MED ORDER — LIDOCAINE VISCOUS HCL 2 % MT SOLN: 15.0000 mL | OROMUCOSAL | 0 refills | Status: AC | PRN

## 2023-11-11 NOTE — ED Provider Notes (Signed)
 Renaldo Fiddler    CSN: [id number] Arrival date & time: 11/11/23  4098      History   Chief Complaint No chief complaint on file.   HPI Deborah Howe is a 53 y.o. female.   Patient presents for evaluation of fever peaking at 101, generalized bodyaches, sore throat, bilateral ear pain occurring intermittently and nasal congestion present for 3 days.  No known sick contacts but recent travel through airport.  Has attempted use of Mucinex and Tylenol.  Poor intake.  Denies shortness of breath, wheezing.  Temperature 99.8, pulse rate 98, O2 saturation 94% on room air, blood pressure 117/82, respirations 17  Past Medical History:  Diagnosis Date   Diabetes mellitus without complication (HCC)    Type II   Hypertension     There are no active problems to display for this patient.   Past Surgical History:  Procedure Laterality Date   ABDOMINAL HYSTERECTOMY      OB History   No obstetric history on file.      Home Medications    Prior to Admission medications   Not on File    Family History No family history on file.  Social History Social History   Tobacco Use   Smoking status: Never   Smokeless tobacco: Never  Substance Use Topics   Alcohol use: Yes    Comment: social   Drug use: Not Currently     Allergies   Lisinopril, Penicillin g, and Penicillins   Review of Systems Review of Systems   Physical Exam Triage Vital Signs ED Triage Vitals  Encounter Vitals Group     BP      Systolic BP Percentile      Diastolic BP Percentile      Pulse      Resp      Temp      Temp src      SpO2      Weight      Height      Head Circumference      Peak Flow      Pain Score      Pain Loc      Pain Education      Exclude from Growth Chart    No data found.  Updated Vital Signs There were no vitals taken for this visit.  Visual Acuity Right Eye Distance:   Left Eye Distance:   Bilateral Distance:    Right Eye Near:   Left Eye Near:     Bilateral Near:     Physical Exam Constitutional:      Appearance: Normal appearance.  HENT:     Head: Normocephalic.     Right Ear: Tympanic membrane, ear canal and external ear normal.     Left Ear: Tympanic membrane, ear canal and external ear normal.     Nose: Congestion present.     Mouth/Throat:     Pharynx: No oropharyngeal exudate or posterior oropharyngeal erythema.  Eyes:     Extraocular Movements: Extraocular movements intact.  Cardiovascular:     Rate and Rhythm: Normal rate and regular rhythm.     Pulses: Normal pulses.     Heart sounds: Normal heart sounds.  Pulmonary:     Effort: Pulmonary effort is normal.     Breath sounds: Normal breath sounds.  Neurological:     Mental Status: She is alert and oriented to person, place, and time. Mental status is at baseline.      UC Treatments /  Results  Labs (all labs ordered are listed, but only abnormal results are displayed) Labs Reviewed - No data to display  EKG   Radiology No results found.  Procedures Procedures (including critical care time)  Medications Ordered in UC Medications - No data to display  Initial Impression / Assessment and Plan / UC Course  I have reviewed the triage vital signs and the nursing notes.  Pertinent labs & imaging results that were available during my care of the patient were reviewed by me and considered in my medical decision making (see chart for details).  Viral URI   Patient is in no signs of distress nor toxic appearing.  Vital signs are stable.  Low suspicion for pneumonia, pneumothorax or bronchitis and therefore will defer imaging.  COVID flu and strep testing negative, discussed.  Prescribed viscous lidocaine.May use additional over-the-counter medications as needed for supportive care.  May follow-up with urgent care as needed if symptoms persist or worsen.    Final Clinical Impressions(s) / UC Diagnoses   Final diagnoses:  None   Discharge Instructions    None    ED Prescriptions   None    PDMP not reviewed this encounter.   Valinda Hoar, [place] 11/11/23 [phone]

## 2023-11-11 NOTE — ED Triage Notes (Signed)
 Patient seen by adrienne white np.

## 2023-11-11 NOTE — Discharge Instructions (Signed)
 Your symptoms today are most likely being caused by a virus and should steadily improve in time it can take up to 7 to 10 days before you truly start to see a turnaround however things will get better  COVID flu and strep testing negative  You may gargle and spit lidocaine solution every 4 hours as needed to help soothe the throat    You can take Tylenol and/or Ibuprofen as needed for fever reduction and pain relief.   For cough: honey 1/2 to 1 teaspoon (you can dilute the honey in water or another fluid).  You can also use guaifenesin and dextromethorphan for cough. You can use a humidifier for chest congestion and cough.  If you don't have a humidifier, you can sit in the bathroom with the hot shower running.      For sore throat: try warm salt water gargles, cepacol lozenges, throat spray, warm tea or water with lemon/honey, popsicles or ice, or OTC cold relief medicine for throat discomfort.   For congestion: take a daily anti-histamine like Zyrtec, Claritin, and a oral decongestant, such as pseudoephedrine.  You can also use Flonase 1-2 sprays in each nostril daily.   It is important to stay hydrated: drink plenty of fluids (water, gatorade/powerade/pedialyte, juices, or teas) to keep your throat moisturized and help further relieve irritation/discomfort.

## 2023-11-11 NOTE — ED Notes (Signed)
 This nurse not involved in patient care.  This nurse not on site when patient present

## 2024-04-08 ENCOUNTER — Other Ambulatory Visit: Payer: Self-pay

## 2024-04-08 ENCOUNTER — Encounter: Payer: Self-pay | Admitting: Emergency Medicine

## 2024-04-08 ENCOUNTER — Emergency Department: Admission: EM | Admit: 2024-04-08 | Discharge: 2024-04-08 | Disposition: A | Discharge status: Home / Self Care

## 2024-04-08 DIAGNOSIS — M5442 Lumbago with sciatica, left side: Secondary | ICD-10-CM | POA: Insufficient documentation

## 2024-04-08 DIAGNOSIS — E119 Type 2 diabetes mellitus without complications: Secondary | ICD-10-CM | POA: Insufficient documentation

## 2024-04-08 DIAGNOSIS — M5440 Lumbago with sciatica, unspecified side: Secondary | ICD-10-CM

## 2024-04-08 DIAGNOSIS — M545 Low back pain, unspecified: Secondary | ICD-10-CM | POA: Diagnosis present

## 2024-04-08 MED ORDER — KETOROLAC TROMETHAMINE 30 MG/ML IJ SOLN
30.0000 mg | Freq: Once | INTRAMUSCULAR | Status: AC
  Administered 2024-04-08: 30 mg via INTRAMUSCULAR

## 2024-04-08 MED ORDER — LIDOCAINE 5 % EX PTCH: 1.0000 | MEDICATED_PATCH | Freq: Two times a day (BID) | CUTANEOUS | 0 refills | Status: AC

## 2024-04-08 MED ORDER — KETOROLAC TROMETHAMINE 30 MG/ML IJ SOLN
60.0000 mg | Freq: Once | INTRAMUSCULAR | Status: DC
  Filled 2024-04-08: qty 2

## 2024-04-08 MED ORDER — NAPROXEN 500 MG PO TBEC: 500.0000 mg | DELAYED_RELEASE_TABLET | Freq: Two times a day (BID) | ORAL | 0 refills | Status: AC

## 2024-04-08 MED ORDER — LIDOCAINE 5 % EX PTCH
1.0000 | MEDICATED_PATCH | CUTANEOUS | Status: DC
  Administered 2024-04-08: 1 via TRANSDERMAL
  Filled 2024-04-08: qty 1

## 2024-04-08 MED ORDER — CYCLOBENZAPRINE HCL 10 MG PO TABS
10.0000 mg | ORAL_TABLET | Freq: Once | ORAL | Status: AC
  Administered 2024-04-08: 10 mg via ORAL
  Filled 2024-04-08: qty 1

## 2024-04-08 MED ORDER — ACETAMINOPHEN 650 MG RE SUPP: 650.0000 mg | Freq: Four times a day (QID) | RECTAL | 0 refills | Status: DC | PRN

## 2024-04-08 MED ORDER — ACETAMINOPHEN 325 MG PO TABS: 650.0000 mg | ORAL_TABLET | Freq: Once | ORAL | Status: DC

## 2024-04-08 MED ORDER — CYCLOBENZAPRINE HCL 10 MG PO TABS: 10.0000 mg | ORAL_TABLET | Freq: Three times a day (TID) | ORAL | 0 refills | Status: AC | PRN

## 2024-04-08 MED ORDER — ACETAMINOPHEN ER 650 MG PO TBCR: 650.0000 mg | EXTENDED_RELEASE_TABLET | Freq: Three times a day (TID) | ORAL | 0 refills | Status: AC | PRN

## 2024-04-08 NOTE — ED Triage Notes (Signed)
 Patient to ED via [facility] for lower back pain that radiates down left leg. Ongoing since Sunday. Denies injury. Hx sciatica

## 2024-04-08 NOTE — Discharge Instructions (Addendum)
 You have been diagnosed with acute left-sided low back pain with sciatica.  Please take Flexeril  1 tablet by mouth every 8 hours.  Please avoid driving while taking Flexeril .  You can take naproxen  1 tablet by mouth every 12 hours with meals.  Please take acetaminophen  1 tablet by mouth every 8 hours.  You can alternate naproxen  and acetaminophen .  Please place 1 patch onto the skin every 12 hours.  Please come back to ED or your PCP if you have new symptoms symptoms.SABRA  You can make an appointment with Dr. Lorelle in orthopedics [place] clinic, or go to [facility].

## 2024-04-08 NOTE — ED Provider Notes (Signed)
 [facility] Provider Note    Event Date/Time   First MD Initiated Contact with Patient 04/08/24 1858     (approximate)   History   Back Pain    HPI  Deborah Howe is a 53 y.o. female    with a past medical history of diabetes type 2, breast nodule, frequent PVCs, pulmonary nodule, right sided chest wall pain, chest pain, enlarged thyroid, angina at rest,  who presents to the ED complaining of lower back pain. According to the patient, symptoms started last Sunday, with lower lumbar back pain, pain radiates to left thigh and knee.  Patient denies urinary incontinence, urinary retention, fecal incontinence.  Patient has history of sciatic nerve.  Patient was seen by orthopedics 2 years ago and had lumbar injection.  No history of trauma.     There are no active problems to display for this patient.    ROS: Patient currently denies any vision changes, tinnitus, difficulty speaking, facial droop, sore throat, chest pain, shortness of breath, abdominal pain, nausea/vomiting/diarrhea, dysuria, or weakness/numbness/paresthesias in any extremity   Physical Exam   Triage Vital Signs: ED Triage Vitals  Encounter Vitals Group     BP 04/08/24 1712 (!) 146/101     Girls Systolic BP Percentile --      Girls Diastolic BP Percentile --      Boys Systolic BP Percentile --      Boys Diastolic BP Percentile --      Pulse Rate 04/08/24 1712 68     Resp 04/08/24 1712 17     Temp 04/08/24 1712 97.8 F (36.6 C)     Temp Source 04/08/24 1712 Oral     SpO2 04/08/24 1712 100 %     Weight 04/08/24 1713 168 lb (76.2 kg)     Height 04/08/24 1713 5' 4 (1.626 m)     Head Circumference --      Peak Flow --      Pain Score 04/08/24 1716 9     Pain Loc --      Pain Education --      Exclude from Growth Chart --     Most recent vital signs: Vitals:   04/08/24 1712  BP: (!) 146/101  Pulse: 68  Resp: 17  Temp: 97.8 F (36.6 C)  SpO2: 100%     Physical  Exam Vitals and nursing note reviewed.  During triage patient was hypertensive  Constitutional:      General: Awake and alert. No acute distress.  Patient is bending, to minimize pain.    Appearance: Normal appearance. The patient is normal weight.      Able to speak in complete sentences without cough or dyspnea  HENT:     Head: Normocephalic and atraumatic.     Mouth: Mucous membranes are moist.  Eyes:     General: PERRL. Normal EOMs          Conjunctiva/sclera: Conjunctivae normal.  Nose No congestion/rhinorrhea  CV:                  Good peripheral perfusion.  Regular rate and rhythm  Resp:               Normal effort.  Equal breath sounds bilaterally.  Abd:                 No distention.  Soft, nontender.  No rebound or guarding.  Musculoskeletal:        General: No  swelling. Normal range of motion.  Lumbar spine: Skin is intact no ecchymosis no hematomas.  Tender to palpation in L5, S1.  Tenderness to palpation in paraspinal muscles in the same area.  Left SLR positive.  No signs of saddle anesthesia.  Sensation is intact.  Skin:    General: Skin is warm and dry.     Capillary Refill: Capillary refill takes less than 2 seconds.     Findings: No rash.  Neurological:     Mental Status: The patient is awake and alert. MAE spontaneously. No gross focal neurologic deficits are appreciated.  Psychiatric Mood and affect are normal. Speech and behavior are normal.  ED Results / Procedures / Treatments   Labs (all labs ordered are listed, but only abnormal results are displayed) Labs Reviewed - No data to display   EKG     RADIOLOGY I independently reviewed and interpreted imaging and agree with radiologists findings.      PROCEDURES:  Critical Care performed:   Procedures   MEDICATIONS ORDERED IN ED: Medications  lidocaine  (LIDODERM ) 5 % 1 patch (1 patch Transdermal Patch Applied 04/08/24 1958)  cyclobenzaprine  (FLEXERIL ) tablet 10 mg (10 mg Oral Given 04/08/24  1952)  ketorolac  (TORADOL ) 30 MG/ML injection 30 mg (30 mg Intramuscular Given 04/08/24 1953)   Clinical Course as of 04/08/24 2023  Wed Apr 08, 2024  2015 Reassessed the patient after getting Flexeril , lidocaine  patch and Toradol .  Patient endorses feeling better.  Patient is ready for discharge.  Patient will have a follow-up with orthopedics.  Patient is agreeable with [AE]    Clinical Course User Index [AE] Janit Kast, PA-C    IMPRESSION / MDM / ASSESSMENT AND PLAN / ED COURSE  I reviewed the triage vital signs and the nursing notes.  Differential diagnosis includes, but is not limited to, lumbar sprain, lumbar sciatic nerve, fracture, spondylolisthesis  Patient's presentation is most consistent with acute, uncomplicated illness.  Deborah Howe is a 53 y.o., female who presents today with history of 4 days of lower lumbar back pain, pain radiates to the left thigh and knee.  Patient denies urinary, incontinence, urinary retention, fecal incontinence.  On physical exam lumbar spine is tender to palpation at the level of L5, S1.  Paraspinal muscles tenderness.  Left SLR positive.  No signs of saddle anesthesia.  Sensation is intact.  Pulses are positive Plan Ice pack Toradol  Flexeril  Lidocaine  patch Reassess Patient's diagnosis is consistent with lumbar muscle strain.  I did not order lumbar x-ray, patient does not have history of trauma. I did review the patient's allergies and medications.The patient is in stable and satisfactory condition for discharge home  Patient will be discharged home with prescriptions for Flexeril , lidocaine  patch, naproxen , acetaminophen .  I did advise the patient to avoid driving while taking Flexeril  .Patient is to follow up with orthopedics as needed or otherwise directed. Patient is given ED precautions to return to the ED for any worsening or new symptoms. Discussed plan of care with patient, answered all of patient's questions, Patient agreeable to  plan of care. Advised patient to take medications according to the instructions on the label. Discussed possible side effects of new medications. Patient verbalized understanding.   FINAL CLINICAL IMPRESSION(S) / ED DIAGNOSES   Final diagnoses:  Acute left-sided low back pain with sciatica, sciatica laterality unspecified     Rx / DC Orders   ED Discharge Orders          Ordered    cyclobenzaprine  (  FLEXERIL ) 10 MG tablet  3 times daily PRN        04/08/24 2018    lidocaine  (LIDODERM ) 5 %  Every 12 hours        04/08/24 2018    naproxen  (EC NAPROSYN ) 500 MG EC tablet  2 times daily with meals        04/08/24 2018    acetaminophen  (TYLENOL ) 650 MG suppository  Every 6 hours PRN,   Status:  Discontinued        04/08/24 2018    acetaminophen  (ACETAMINOPHEN  8 HOUR) 650 MG CR tablet  Every 8 hours PRN        04/08/24 2022             Note:  This document was prepared using Dragon voice recognition software and may include unintentional dictation errors.   Janit Kast, PA-C 04/08/24 2023    Clarine Ozell LABOR, MD 04/10/24 [phone]

## 2024-09-11 ENCOUNTER — Other Ambulatory Visit: Payer: Self-pay | Admitting: Family Medicine

## 2024-09-11 DIAGNOSIS — M5416 Radiculopathy, lumbar region: Secondary | ICD-10-CM

## 2024-09-18 ENCOUNTER — Encounter: Payer: Self-pay | Admitting: Family Medicine
